# Patient Record
Sex: Male | Born: 1955 | Race: Black or African American | Hispanic: Refuse to answer | Marital: Married | Smoking: Never smoker
Health system: Southern US, Community
[De-identification: ages and names within clinical notes are randomized; demographics above are authoritative.]

---

## 1998-03-17 ENCOUNTER — Encounter: Admission: RE | Admit: 1998-03-17 | Discharge: 1998-03-17 | Payer: Self-pay | Admitting: Family Medicine

## 1998-07-07 ENCOUNTER — Encounter: Admission: RE | Admit: 1998-07-07 | Discharge: 1998-07-07 | Payer: Self-pay | Admitting: Family Medicine

## 1998-07-27 ENCOUNTER — Encounter: Admission: RE | Admit: 1998-07-27 | Discharge: 1998-07-27 | Payer: Self-pay | Admitting: Family Medicine

## 1998-08-05 ENCOUNTER — Encounter: Admission: RE | Admit: 1998-08-05 | Discharge: 1998-08-05 | Payer: Self-pay | Admitting: Family Medicine

## 1998-09-12 ENCOUNTER — Encounter: Admission: RE | Admit: 1998-09-12 | Discharge: 1998-09-12 | Payer: Self-pay | Admitting: Family Medicine

## 1998-11-29 ENCOUNTER — Encounter: Admission: RE | Admit: 1998-11-29 | Discharge: 1998-11-29 | Payer: Self-pay | Admitting: Family Medicine

## 1999-08-08 ENCOUNTER — Encounter: Admission: RE | Admit: 1999-08-08 | Discharge: 1999-08-08 | Payer: Self-pay | Admitting: *Deleted

## 1999-09-01 ENCOUNTER — Encounter: Admission: RE | Admit: 1999-09-01 | Discharge: 1999-09-01 | Payer: Self-pay | Admitting: Sports Medicine

## 1999-09-11 ENCOUNTER — Encounter: Admission: RE | Admit: 1999-09-11 | Discharge: 1999-09-11 | Payer: Self-pay | Admitting: Family Medicine

## 2000-04-16 ENCOUNTER — Encounter: Admission: RE | Admit: 2000-04-16 | Discharge: 2000-04-16 | Payer: Self-pay | Admitting: Family Medicine

## 2000-06-06 ENCOUNTER — Encounter: Admission: RE | Admit: 2000-06-06 | Discharge: 2000-06-06 | Payer: Self-pay | Admitting: Family Medicine

## 2000-06-17 ENCOUNTER — Encounter: Admission: RE | Admit: 2000-06-17 | Discharge: 2000-06-17 | Payer: Self-pay | Admitting: Family Medicine

## 2000-06-26 ENCOUNTER — Encounter: Admission: RE | Admit: 2000-06-26 | Discharge: 2000-06-26 | Payer: Self-pay | Admitting: Family Medicine

## 2000-07-26 ENCOUNTER — Encounter: Admission: RE | Admit: 2000-07-26 | Discharge: 2000-07-26 | Payer: Self-pay | Admitting: Family Medicine

## 2000-08-28 ENCOUNTER — Encounter: Admission: RE | Admit: 2000-08-28 | Discharge: 2000-08-28 | Payer: Self-pay | Admitting: Family Medicine

## 2001-02-21 ENCOUNTER — Encounter: Admission: RE | Admit: 2001-02-21 | Discharge: 2001-02-21 | Payer: Self-pay | Admitting: Family Medicine

## 2001-04-17 ENCOUNTER — Encounter: Admission: RE | Admit: 2001-04-17 | Discharge: 2001-04-17 | Payer: Self-pay | Admitting: Family Medicine

## 2002-03-26 ENCOUNTER — Encounter: Admission: RE | Admit: 2002-03-26 | Discharge: 2002-03-26 | Payer: Self-pay | Admitting: Family Medicine

## 2002-04-06 ENCOUNTER — Encounter: Admission: RE | Admit: 2002-04-06 | Discharge: 2002-04-06 | Payer: Self-pay | Admitting: Family Medicine

## 2002-06-01 ENCOUNTER — Encounter: Payer: Self-pay | Admitting: Sports Medicine

## 2002-06-01 ENCOUNTER — Encounter: Admission: RE | Admit: 2002-06-01 | Discharge: 2002-06-01 | Payer: Self-pay | Admitting: Sports Medicine

## 2002-06-01 ENCOUNTER — Encounter: Admission: RE | Admit: 2002-06-01 | Discharge: 2002-06-01 | Payer: Self-pay | Admitting: Family Medicine

## 2002-06-22 ENCOUNTER — Encounter: Admission: RE | Admit: 2002-06-22 | Discharge: 2002-06-22 | Payer: Self-pay | Admitting: Family Medicine

## 2002-07-06 ENCOUNTER — Encounter: Admission: RE | Admit: 2002-07-06 | Discharge: 2002-07-06 | Payer: Self-pay | Admitting: Family Medicine

## 2002-07-06 ENCOUNTER — Encounter: Payer: Self-pay | Admitting: Family Medicine

## 2002-12-26 ENCOUNTER — Encounter: Payer: Self-pay | Admitting: Emergency Medicine

## 2002-12-26 ENCOUNTER — Emergency Department (HOSPITAL_COMMUNITY): Admission: EM | Admit: 2002-12-26 | Discharge: 2002-12-26 | Payer: Self-pay | Admitting: Emergency Medicine

## 2003-02-05 ENCOUNTER — Encounter: Admission: RE | Admit: 2003-02-05 | Discharge: 2003-02-05 | Payer: Self-pay | Admitting: Sports Medicine

## 2003-02-05 ENCOUNTER — Encounter: Admission: RE | Admit: 2003-02-05 | Discharge: 2003-02-05 | Payer: Self-pay | Admitting: Family Medicine

## 2003-03-30 ENCOUNTER — Encounter: Admission: RE | Admit: 2003-03-30 | Discharge: 2003-03-30 | Payer: Self-pay | Admitting: Family Medicine

## 2003-11-19 ENCOUNTER — Ambulatory Visit: Payer: Self-pay | Admitting: Family Medicine

## 2004-01-28 ENCOUNTER — Ambulatory Visit: Payer: Self-pay | Admitting: Sports Medicine

## 2005-01-18 ENCOUNTER — Ambulatory Visit: Payer: Self-pay | Admitting: Family Medicine

## 2005-04-19 ENCOUNTER — Ambulatory Visit: Payer: Self-pay | Admitting: Family Medicine

## 2005-07-19 ENCOUNTER — Ambulatory Visit: Payer: Self-pay | Admitting: Family Medicine

## 2005-08-10 ENCOUNTER — Ambulatory Visit: Payer: Self-pay | Admitting: Family Medicine

## 2005-08-20 ENCOUNTER — Encounter: Admission: RE | Admit: 2005-08-20 | Discharge: 2005-08-20 | Payer: Self-pay | Admitting: Sports Medicine

## 2005-09-14 ENCOUNTER — Ambulatory Visit: Payer: Self-pay | Admitting: Family Medicine

## 2006-03-29 ENCOUNTER — Ambulatory Visit: Payer: Self-pay | Admitting: Family Medicine

## 2006-05-24 ENCOUNTER — Ambulatory Visit: Payer: Self-pay | Admitting: Sports Medicine

## 2006-05-24 ENCOUNTER — Encounter (INDEPENDENT_AMBULATORY_CARE_PROVIDER_SITE_OTHER): Payer: Self-pay | Admitting: Family Medicine

## 2006-05-24 DIAGNOSIS — E7800 Pure hypercholesterolemia, unspecified: Secondary | ICD-10-CM | POA: Insufficient documentation

## 2006-05-24 DIAGNOSIS — E039 Hypothyroidism, unspecified: Secondary | ICD-10-CM | POA: Insufficient documentation

## 2006-05-24 DIAGNOSIS — I10 Essential (primary) hypertension: Secondary | ICD-10-CM | POA: Insufficient documentation

## 2006-05-24 LAB — CONVERTED CEMR LAB
ALT: 40 units/L (ref 0–53)
AST: 34 units/L (ref 0–37)
Alkaline Phosphatase: 61 units/L (ref 39–117)
Cholesterol: 206 mg/dL — ABNORMAL HIGH (ref 0–200)
Creatinine, Ser: 0.95 mg/dL (ref 0.40–1.50)
Free T4: 0.99 ng/dL (ref 0.89–1.80)
LDL Cholesterol: 127 mg/dL — ABNORMAL HIGH (ref 0–99)
Sodium: 145 meq/L (ref 135–145)
TSH: 0.853 microintl units/mL (ref 0.350–5.50)
Total Bilirubin: 0.8 mg/dL (ref 0.3–1.2)
Total CHOL/HDL Ratio: 3.3
Total Protein: 8 g/dL (ref 6.0–8.3)
VLDL: 16 mg/dL (ref 0–40)

## 2007-04-01 ENCOUNTER — Ambulatory Visit (HOSPITAL_COMMUNITY): Admission: RE | Admit: 2007-04-01 | Discharge: 2007-04-01 | Payer: Self-pay | Admitting: Family Medicine

## 2007-04-01 ENCOUNTER — Ambulatory Visit: Payer: Self-pay | Admitting: Family Medicine

## 2007-04-10 ENCOUNTER — Encounter: Payer: Self-pay | Admitting: Family Medicine

## 2007-04-15 ENCOUNTER — Encounter: Payer: Self-pay | Admitting: Family Medicine

## 2007-05-16 ENCOUNTER — Encounter: Payer: Self-pay | Admitting: Family Medicine

## 2007-05-16 ENCOUNTER — Ambulatory Visit: Payer: Self-pay | Admitting: Family Medicine

## 2007-05-16 LAB — CONVERTED CEMR LAB
ALT: 30 units/L (ref 0–53)
Alkaline Phosphatase: 51 units/L (ref 39–117)
CO2: 24 meq/L (ref 19–32)
Cholesterol: 201 mg/dL — ABNORMAL HIGH (ref 0–200)
Creatinine, Ser: 0.96 mg/dL (ref 0.40–1.50)
LDL Cholesterol: 135 mg/dL — ABNORMAL HIGH (ref 0–99)
PSA: 0.47 ng/mL (ref 0.10–4.00)
Sodium: 144 meq/L (ref 135–145)
Total Bilirubin: 0.7 mg/dL (ref 0.3–1.2)
Total CHOL/HDL Ratio: 3.7
VLDL: 11 mg/dL (ref 0–40)

## 2007-05-19 ENCOUNTER — Encounter: Payer: Self-pay | Admitting: Family Medicine

## 2007-12-12 ENCOUNTER — Encounter: Payer: Self-pay | Admitting: Family Medicine

## 2007-12-12 ENCOUNTER — Ambulatory Visit: Payer: Self-pay | Admitting: Family Medicine

## 2007-12-12 LAB — CONVERTED CEMR LAB
AST: 27 units/L (ref 0–37)
BUN: 13 mg/dL (ref 6–23)
CO2: 26 meq/L (ref 19–32)
Calcium: 9.4 mg/dL (ref 8.4–10.5)
Chloride: 104 meq/L (ref 96–112)
Creatinine, Ser: 1 mg/dL (ref 0.40–1.50)
TSH: 0.596 microintl units/mL (ref 0.350–4.50)
Total Bilirubin: 0.7 mg/dL (ref 0.3–1.2)

## 2007-12-15 ENCOUNTER — Encounter: Payer: Self-pay | Admitting: Family Medicine

## 2008-11-29 ENCOUNTER — Encounter: Payer: Self-pay | Admitting: Family Medicine

## 2008-11-29 ENCOUNTER — Ambulatory Visit: Payer: Self-pay | Admitting: Family Medicine

## 2008-11-29 LAB — CONVERTED CEMR LAB
Alkaline Phosphatase: 56 units/L (ref 39–117)
BUN: 14 mg/dL (ref 6–23)
CO2: 25 meq/L (ref 19–32)
Cholesterol: 204 mg/dL — ABNORMAL HIGH (ref 0–200)
Creatinine, Ser: 0.96 mg/dL (ref 0.40–1.50)
Glucose, Bld: 97 mg/dL (ref 70–99)
HDL: 63 mg/dL (ref >39)
LDL Cholesterol: 126 mg/dL — ABNORMAL HIGH (ref 0–99)
Total Bilirubin: 0.5 mg/dL (ref 0.3–1.2)
Total CHOL/HDL Ratio: 3.2
Total Protein: 7.5 g/dL (ref 6.0–8.3)
Triglycerides: 76 mg/dL (ref <150)
VLDL: 15 mg/dL (ref 0–40)

## 2008-11-30 ENCOUNTER — Encounter: Payer: Self-pay | Admitting: Family Medicine

## 2009-04-11 ENCOUNTER — Encounter: Payer: Self-pay | Admitting: Family Medicine

## 2009-04-29 ENCOUNTER — Encounter: Payer: Self-pay | Admitting: Family Medicine

## 2009-11-04 ENCOUNTER — Ambulatory Visit: Payer: Self-pay | Admitting: Family Medicine

## 2009-11-04 DIAGNOSIS — H60339 Swimmer's ear, unspecified ear: Secondary | ICD-10-CM

## 2009-12-23 ENCOUNTER — Ambulatory Visit: Payer: Self-pay | Admitting: Family Medicine

## 2009-12-23 ENCOUNTER — Encounter: Payer: Self-pay | Admitting: Family Medicine

## 2009-12-23 DIAGNOSIS — L219 Seborrheic dermatitis, unspecified: Secondary | ICD-10-CM

## 2009-12-23 LAB — CONVERTED CEMR LAB
BUN: 15 mg/dL (ref 6–23)
CO2: 27 meq/L (ref 19–32)
Cholesterol: 199 mg/dL (ref 0–200)
Glucose, Bld: 83 mg/dL (ref 70–99)
HDL: 61 mg/dL (ref >39)
Sodium: 142 meq/L (ref 135–145)
Total Bilirubin: 0.7 mg/dL (ref 0.3–1.2)
Total Protein: 7.7 g/dL (ref 6.0–8.3)
Triglycerides: 60 mg/dL (ref <150)
VLDL: 12 mg/dL (ref 0–40)

## 2009-12-27 ENCOUNTER — Encounter: Payer: Self-pay | Admitting: Family Medicine

## 2010-04-18 NOTE — Miscellaneous (Signed)
Summary: [facility]  [facility]   Imported By: Kenneth Tapia 04/29/2009 11:15:40  _____________________________________________________________________  External Attachment:    Type:   Image     Comment:   External Document

## 2010-04-18 NOTE — Miscellaneous (Signed)
  Clinical Lists Changes  Observations: Added new observation of DM PROGRESS: N/A (04/29/2009 11:30) Added new observation of DM FSREVIEW: N/A (04/29/2009 11:30)      Prevention & Chronic Care Immunizations   Influenza vaccine: given  (12/12/2007)   Influenza vaccine due: 12/11/2008    Tetanus booster: 12/12/2007: given   Tetanus booster due: 12/11/2017    Pneumococcal vaccine: Not documented  Colorectal Screening   Hemoccult: Done.  (08/18/1999)   Hemoccult due: Not Indicated    Colonoscopy: Done.  (05/17/2005)   Colonoscopy due: 05/18/2015  Other Screening   PSA: 0.35  (11/29/2008)   PSA action/deferral: Discussed-PSA requested  (11/29/2008)   PSA due due: 05/15/2008   Smoking status: never  (11/29/2008)  Lipids   Total Cholesterol: 204  (11/29/2008)   Lipid panel action/deferral: Lipid Panel ordered   LDL: 126  (11/29/2008)   LDL Direct: 126  (12/12/2007)   HDL: 63  (11/29/2008)   Triglycerides: 76  (11/29/2008)    SGOT (AST): 35  (11/29/2008)   SGPT (ALT): 35  (11/29/2008)   Alkaline phosphatase: 56  (11/29/2008)   Total bilirubin: 0.5  (11/29/2008)  Hypertension   Last Blood Pressure: 134 / 97  (11/29/2008)   Serum creatinine: 0.96  (11/29/2008)   BMP action: Ordered   Serum potassium 4.7  (11/29/2008)  Self-Management Support :   Personal Goals (by the next clinic visit) :      Personal blood pressure goal: 130/80  (11/29/2008)     Personal LDL goal: 100  (11/29/2008)    Hypertension self-management support: Not documented    Lipid self-management support: Not documented

## 2010-04-18 NOTE — Assessment & Plan Note (Signed)
Summary: cpe,df   Vital Signs:  Patient profile:   55 year old male Height:      72.75 inches Weight:      248 pounds BMI:     33.06 Pulse rate:   78 / minute BP sitting:   130 / 80  (right arm) Cuff size:   large  Vitals Entered By: Renato Battles slade,cma CC: physical. flu shot. refill meds Is Patient Diabetic? No Pain Assessment Patient in pain? no        Primary Care Makeda Peeks:  Luretha Murphy NP  CC:  physical. flu shot. refill meds.  History of Present Illness: Yearly visit for med refills.  He feels well.  He exercises some and knows he should eat better and exercise more but is not motivated to change.  Denies depression.  Had been seeing a dermatologist to treat seborrhea on face and scalp and prefers to to return for high co-pay.  Uses topical steroid on scalp and combo ketoconazole/steroid on face, this works although this is a chronic problem.    Habits & Providers  Alcohol-Tobacco-Diet     Tobacco Status: never  Current Medications (verified): 1)  Crestor 20 Mg  Tabs (Rosuvastatin Calcium) .... One Qhs 2)  Lisinopril-Hydrochlorothiazide 20-12.5 Mg Tabs (Lisinopril-Hydrochlorothiazide) .... One Daily, 3)  Aspir-Low 81 Mg Tbec (Aspirin) .Marland Kitchen.. 1 Once Daily After Meal 4)  Daily Vitamins  Tabs (Multiple Vitamin) 5)  Ketoconazole 2 % Crea (Ketoconazole) .... Compound With Cutivate in A 1:1 Combo, 60 Gm 6)  Clobetasol Propionate 0.05 % Soln (Clobetasol Propionate) .... As Directed Twice Weekly, 50 Ml  Allergies (verified): No Known Drug Allergies  Past History:  Past Surgical History: Last updated: 04/01/2007 Colonoscopy (internal hemorroids) - 05/17/2005,  MRI - cx spine mild MRI - cx spine mild DJD - 07/29/2002  Family History: Last updated: 12/23/2009 father - hypercholesterol, no cad mother-Colon  adenomatous polyps  Social History: Last updated: 12/23/2009 No smoking, no ETOH use. Married. Exercise daily  1 hour a day (treadmil and lifting weights). Works  @ [employer].  Past Medical History: hx of elevated transaminases w/o etiology,  lipoma L side trunk (05/07),  nephrolithiasis  w/ severe obstruction 06/07:lumbar spine: spondylolisthesis L1-L2, osteophytes,post facet changes,  Family History: Reviewed history from 05/16/2006 and no changes required. father - hypercholesterol, no cad mother-Colon  adenomatous polyps  Social History: Reviewed history from 05/16/2006 and no changes required. No smoking, no ETOH use. Married. Exercise daily  1 hour a day (treadmil and lifting weights). Works @ [employer].  Review of Systems      See HPI General:  Denies malaise, sleep disorder, and sweats. CV:  Denies chest pain or discomfort and swelling of feet. GI:  Denies constipation. GU:  Denies urinary hesitancy. MS:  Denies joint pain. Derm:  Complains of changes in nail beds, dryness, itching, and rash.  Physical Exam  General:  Well-developed,well-nourished,in no acute distress; alert,appropriate and cooperative throughout examination Eyes:  No corneal or conjunctival inflammation noted. EOMI. Perrla. Funduscopic exam benign, without hemorrhages, exudates or papilledema.  Ears:  R ear normal and L ear normal.   Mouth:  pharynx pink and moist and fair dentition.   Neck:  No deformities, masses, or tenderness noted. Lungs:  normal respiratory effort and normal breath sounds.   Heart:  normal rate, regular rhythm, and no murmur.  occasional extra beats Abdomen:  soft, non-tender, and normal bowel sounds.   Msk:  No deformity or scoliosis noted of thoracic or lumbar spine.  Extremities:  No clubbing, cyanosis, edema, or deformity noted with normal full range of motion of all joints.   Skin:  seborrheic changes in T zone of face and annular raised sclay areas on the scalp. Psych:  normally interactive.     Impression & Recommendations:  Problem # 1:  HEALTH MAINTENANCE EXAM (ICD-V70.0)  Orders: [facility] - Est  40-64 yrs  ([address]109)  Problem # 2:  HYPERLIPIDEMIA (ICD-272.4)  His updated medication list for this problem includes:    Crestor 20 Mg Tabs (Rosuvastatin calcium) ..... One qhs  Orders: Comp Met-[facility] [id number]) Lipid-[facility] ([id number])  Problem # 3:  HYPERTENSION, BENIGN (ICD-401.1)  His updated medication list for this problem includes:    Lisinopril-hydrochlorothiazide 20-12.5 Mg Tabs (Lisinopril-hydrochlorothiazide) ..... One daily,  Orders: Comp Met-[facility] ([id number])  Problem # 4:  DERMATITIS, SEBORRHEIC (ICD-690.10)  refilled topicals  Complete Medication List: 1)  Crestor 20 Mg Tabs (Rosuvastatin calcium) .... One qhs 2)  Lisinopril-hydrochlorothiazide 20-12.5 Mg Tabs (Lisinopril-hydrochlorothiazide) .... One daily, 3)  Aspir-low 81 Mg Tbec (Aspirin) .Marland Kitchen.. 1 once daily after meal 4)  Daily Vitamins Tabs (Multiple vitamin) 5)  Ketoconazole 2 % Crea (Ketoconazole) .... Compound with cutivate in a 1:1 combo, 60 gm 6)  Clobetasol Propionate 0.05 % Soln (Clobetasol propionate) .... As directed twice weekly, 50 ml  Other Orders: Influenza Vaccine NON MCR (96295) TSH-[facility] ([id number]) PSA-[facility] ([id number])  Patient Instructions: 1)  Please schedule a follow-up appointment as needed .  Prescriptions: CRESTOR 20 MG  TABS (ROSUVASTATIN CALCIUM) one qhs Brand medically necessary #31 x 11   Entered and Authorized by:   Luretha Murphy NP   Signed by:   Luretha Murphy NP on 12/23/2009   Method used:   Print then Give to Patient   RxID:   [id number] LISINOPRIL-HYDROCHLOROTHIAZIDE 20-12.5 MG TABS (LISINOPRIL-HYDROCHLOROTHIAZIDE) one daily, Brand medically necessary #90 x 3   Entered and Authorized by:   Luretha Murphy NP   Signed by:   Luretha Murphy NP on 12/23/2009   Method used:   Print then Give to Patient   RxID:   [id number] CLOBETASOL PROPIONATE 0.05 % SOLN (CLOBETASOL PROPIONATE) as directed twice weekly, 50 ml Brand medically necessary #1 x 6   Entered and Authorized by:   Luretha Murphy NP   Signed by:   Luretha Murphy NP on 12/23/2009   Method used:   Print then Give to Patient   RxID:   [id number] KETOCONAZOLE 2 % CREA (KETOCONAZOLE) compound with cutivate in a 1:1 combo, 60 GM Brand medically necessary #1 x 6   Entered and Authorized by:   Luretha Murphy NP   Signed by:   Luretha Murphy NP on 12/23/2009   Method used:   Print then Give to Patient   RxID:   [id number]    Influenza Vaccine    Vaccine Type: Fluvax Non-MCR    Site: left deltoid    Mfr: GlaxoSmithKline    Dose: 0.5 ml    Route: IM    Given by: Arlyss Repress CMA,    Exp. Date: 09/13/2010    Lot #: [id number]    VIS given: 10/11/09 version given December 23, 2009.  Flu Vaccine Consent Questions    Do you have a history of severe allergic reactions to this vaccine? no    Any prior history of allergic reactions to egg and/or gelatin? no    Do you have a sensitivity to the preservative Thimersol? no    Do you have a past history of Guillan-Barre  Syndrome? no    Do you currently have an acute febrile illness? no    Have you ever had a severe reaction to latex? no    Vaccine information given and explained to patient? yes   Prevention & Chronic Care Immunizations   Influenza vaccine: Fluvax Non-MCR  (12/23/2009)   Influenza vaccine due: 12/11/2008    Tetanus booster: 12/12/2007: given   Tetanus booster due: 12/11/2017    Pneumococcal vaccine: Not documented  Colorectal Screening   Hemoccult: Done.  (08/18/1999)   Hemoccult due: Not Indicated    Colonoscopy: Done.  (05/17/2005)   Colonoscopy due: 05/18/2015  Other Screening   PSA: 0.35  (11/29/2008)   PSA ordered.   PSA action/deferral: Discussed-PSA requested  (11/29/2008)   PSA due due: 05/15/2008   Smoking status: never  (12/23/2009)  Lipids   Total Cholesterol: 204  (11/29/2008)   Lipid panel action/deferral: Lipid Panel ordered   LDL: 126  (11/29/2008)   LDL Direct: 126  (12/12/2007)   HDL: 63  (11/29/2008)    Triglycerides: 76  (11/29/2008)    SGOT (AST): 35  (11/29/2008)   SGPT (ALT): 35  (11/29/2008) CMP ordered    Alkaline phosphatase: 56  (11/29/2008)   Total bilirubin: 0.5  (11/29/2008)    Lipid flowsheet reviewed?: Yes   Progress toward LDL goal: Unchanged  Hypertension   Last Blood Pressure: 130 / 80  (12/23/2009)   Serum creatinine: 0.96  (11/29/2008)   BMP action: Ordered   Serum potassium 4.7  (11/29/2008) CMP ordered     Hypertension flowsheet reviewed?: Yes   Progress toward BP goal: At goal  Self-Management Support :   Personal Goals (by the next clinic visit) :      Personal blood pressure goal: 130/80  (11/29/2008)     Personal LDL goal: 130  (12/23/2009)    Hypertension self-management support: Not documented    Lipid self-management support: Not documented

## 2010-04-18 NOTE — Assessment & Plan Note (Signed)
Summary: ? ear infection,tcb   Vital Signs:  Patient profile:   55 year old male Height:      72.75 inches Weight:      249 pounds BMI:     33.20 Temp:     98.9 degrees F oral Pulse rate:   80 / minute BP sitting:   145 / 88  (left arm) Cuff size:   large  Vitals Entered By: Tessie Fass CMA (November 04, 2009 11:00 AM) CC: left ear ache   Primary Care Therin Vetsch:  Luretha Murphy NP  CC:  left ear ache.  History of Present Illness: 55 y/o M with HTN and HLD is here for left ear pain.    Pt would like his ear checked for infection.  He has had a cold since last Fri.  He flew in from [place] last Thurs and on Fri he noticed ear pain left ear. It feels like there is water in his ear.  hearing has changed, hearing goes in and out. not sure about discharge, but it feels damp to him.  No water exposure.    ?fever, +chills, + cough, chest congestion, sore throat improved, chest hurts to cough, no syncope, no sweating  Allergies (verified): No Known Drug Allergies  Physical Exam  General:  Well-developed,well-nourished,in no acute distress; alert,appropriate and cooperative throughout examination. vitals reviewed.  Ears:  R ear normal. L ear: tragus painful to pulling. The ear canal is typically edematous and erythematous with debris that is yellow/green. The tympanic membrane is only partially visible.  Mouth:  Oral mucosa and oropharynx without lesions or exudates.   Neck:  No deformities, masses, or tenderness noted. Cervical Nodes:  No lymphadenopathy noted   Impression & Recommendations:  Problem # 1:  OTITIS EXTERNA, ACUTE, LEFT (ICD-380.12) Assessment New Ear pain x 1 week.  Also dealing with URI symptoms.  Pt not diabetic so systemic atbx not necessary.  Will give  His updated medication list for this problem includes:    Cipro Hc 0.2-1 % Susp (Ciprofloxacin-hydrocortisone) .Marland KitchenMarland KitchenMarland KitchenMarland Kitchen 3 drops in each ear two times a day for 7 days.  dispense 1 bottle  Orders: [facility]- Est Level  3  (62130)  Complete Medication List: 1)  Crestor 20 Mg Tabs (Rosuvastatin calcium) .... One qhs 2)  Lisinopril-hydrochlorothiazide 20-12.5 Mg Tabs (Lisinopril-hydrochlorothiazide) .... One daily, 3)  Aspir-low 81 Mg Tbec (Aspirin) .Marland Kitchen.. 1 once daily after meal 4)  Daily Vitamins Tabs (Multiple vitamin) 5)  Cipro Hc 0.2-1 % Susp (Ciprofloxacin-hydrocortisone) .... 3 drops in each ear two times a day for 7 days.  dispense 1 bottle Prescriptions: CIPRO HC 0.2-1 % SUSP (CIPROFLOXACIN-HYDROCORTISONE) 3 drops in each ear two times a day for 7 days.  Dispense 1 bottle  #1 x 0   Entered and Authorized by:   Angeline Slim MD   Signed by:   Angeline Slim MD on 11/04/2009   Method used:   Print then Give to Patient   RxID:   [id number]

## 2010-04-18 NOTE — Letter (Signed)
Summary: Generic Letter  [facility]  [address]   [place], [place] [address]   Phone: [phone]  Fax: [phone]    12/27/2009  TREVIONE WERT [address] [place], [place]  [address]  Dear Mr. ATOR,   It was nice to see you the other day.  I have enclosed your results.  As you see the only abnormal is your LDL cholesterol, of course the Crestor is keeping that down so continue to take it as ordered.  As we discussed regular exercise and decreasing your intake of animal fats are also important.        Sincerely,   Luretha Murphy NP  Appended Document: Generic Letter mailed

## 2010-05-29 ENCOUNTER — Encounter: Payer: Self-pay | Admitting: Family Medicine

## 2010-05-29 ENCOUNTER — Ambulatory Visit (INDEPENDENT_AMBULATORY_CARE_PROVIDER_SITE_OTHER): Payer: BC Managed Care – PPO | Admitting: Family Medicine

## 2010-05-29 VITALS — BP 107/77 | HR 92 | Wt 258.0 lb

## 2010-05-29 DIAGNOSIS — I10 Essential (primary) hypertension: Secondary | ICD-10-CM

## 2010-05-29 DIAGNOSIS — M79676 Pain in unspecified toe(s): Secondary | ICD-10-CM

## 2010-05-29 DIAGNOSIS — M1A0790 Idiopathic chronic gout, unspecified ankle and foot, without tophus (tophi): Secondary | ICD-10-CM | POA: Insufficient documentation

## 2010-05-29 DIAGNOSIS — M79609 Pain in unspecified limb: Secondary | ICD-10-CM

## 2010-05-29 LAB — CONVERTED CEMR LAB: Uric Acid, Serum: 8 mg/dL — ABNORMAL HIGH (ref 4.0–7.8)

## 2010-05-29 LAB — URIC ACID: Uric Acid, Serum: 8 mg/dL — ABNORMAL HIGH (ref 4.0–7.8)

## 2010-05-29 MED ORDER — AMLODIPINE BESYLATE 10 MG PO TABS: 10.0000 mg | ORAL_TABLET | Freq: Every day | ORAL | Status: DC

## 2010-05-29 NOTE — Assessment & Plan Note (Signed)
Questionable angioedema related to ACE, will give him a trial off, add Norvasc at 5 mg and if BP not at goal increase to 10 mg, follow up apt in one month to sort out.

## 2010-05-29 NOTE — Progress Notes (Signed)
  Subjective:    Patient ID: Kenneth Tapia, male    DOB: 08/21/1955, 55 y.o.   MRN: [id number]  HPI:  Feels that his lips are swelling and that it may be the lisinopril, although he has been on this medication for several years.  He reports it seems to be getting worse.  Occurs at night mostly.  He would like to have a trial off to see if this is the cause.  He is also having gouty attacks of her left great toe, he reports swelling, heat and pain.  Several weeks ago he was unable to put shoe on.  He takes Aleve and it improves.  He has never reported this to a provider.    Review of Systems  Constitutional: Negative for activity change, appetite change and fatigue.  HENT: Positive for facial swelling. Negative for congestion and neck stiffness.   Respiratory: Negative for cough, shortness of breath, wheezing and stridor.   Cardiovascular: Negative for chest pain.  Musculoskeletal: Positive for joint swelling.       See HPI       Objective:   Physical Exam  Constitutional: He appears well-developed and well-nourished.  HENT:  Mouth/Throat: Oropharynx is clear and moist.  Cardiovascular: Normal rate, regular rhythm and normal heart sounds.   Pulmonary/Chest: Effort normal and breath sounds normal.  Musculoskeletal: Normal range of motion. He exhibits no tenderness.          Assessment & Plan:

## 2010-05-29 NOTE — Patient Instructions (Signed)
Purine in diet should be reduced If you have a gout attack, take 2 Aleve twice daily for 3 days Norasc (amlodapine) 1/2 tab then one tab if BP is not at goal, goal is 130/80 Return in one month for BP check    Gout Gout is an inflammatory condition (arthritis) caused by a buildup of uric acid crystals in the joints. Uric acid is a chemical that is normally present in the blood. Under some circumstances, uric acid can form into crystals in your joints. This causes joint redness, soreness, and swelling (inflammation). Repeat attacks are common. Over time, uric acid crystals can form into masses (tophi) near a joint, causing disfigurement. Gout is treatable and often preventable. CAUSES The disease begins with elevated levels of uric acid in the blood. Uric acid is produced by your body when it breaks down a naturally found substance called purines. This also happens when you eat certain foods such as meats and fish. Causes of an elevated uric acid level include:  Being passed down from parent to child (heredity).   Diseases that cause increased uric acid production (obesity, psoriasis, some cancers).   Excessive alcohol use.   Diet, especially diets rich in meat and seafood.   Medicines, including certain cancer-fighting drugs (chemotherapy), diuretics, and aspirin.   Chronic kidney disease. The kidneys are no longer able to remove uric acid well.   Problems with metabolism.  Conditions strongly associated with gout include:  Obesity.   High blood pressure.   High cholesterol.   Diabetes.  Not everyone with elevated uric acid levels gets gout. It is not understood why some people get gout and others do not. Surgery, joint injury, and eating too much of certain foods are some of the factors that can lead to gout. SYMPTOMS  An attack of gout comes on quickly. It causes intense pain with redness, swelling, and warmth in a joint.   Fever can occur.   Often, only one joint is  involved. Certain joints are more commonly involved:   Base of the big toe.   Knee.   Ankle.   Wrist.   Finger.  Without treatment, an attack usually goes away in a few days to weeks. Between attacks, you usually will not have symptoms, which is different from many other forms of arthritis. DIAGNOSIS Your caregiver will suspect gout based on your symptoms and exam. Removal of fluid from the joint (arthrocentesis) is done to check for uric acid crystals. Your caregiver will give you a medicine that numbs the area (local anesthetic) and use a needle to remove joint fluid for exam. Gout is confirmed when uric acid crystals are seen in joint fluid, using a special microscope. Sometimes, blood, urine, and X-ray tests are also used. TREATMENT There are 2 phases to gout treatment: treating the sudden onset (acute) attack and preventing attacks (prophylaxis). Treatment of an Acute Attack  Medicines are used. These include anti-inflammatory medicines or steroid medicines.   An injection of steroid medicine into the affected joint is sometimes necessary.   The painful joint is rested. Movement can worsen the arthritis.   You may use warm or cold treatments on painful joints, depending which works best for you.   Discuss the use of coffee, vitamin C, or cherries with your caregiver. These may be helpful treatment options.  Treatment to Prevent Attacks After the acute attack subsides, your caregiver may advise prophylactic medicine. These medicines either help your kidneys eliminate uric acid from your body or decrease your  uric acid production. You may need to stay on these medicines for a very long time. The early phase of treatment with prophylactic medicine can be associated with an increase in acute gout attacks. For this reason, during the first few months of treatment, your caregiver may also advise you to take medicines usually used for acute gout treatment. Be sure you understand your  caregiver's directions. You should also discuss dietary treatment with your caregiver. Certain foods such as meats and fish can increase uric acid levels. Other foods such as dairy can decrease levels. Your caregiver can give you a list of foods to avoid. HOME CARE INSTRUCTIONS  Do not take aspirin to relieve pain. This raises uric acid levels.   Only take over-the-counter or prescription medicines for pain, discomfort, or fever as directed by your caregiver.   Rest the joint as much as possible. When in bed, keep sheets and blankets off painful areas.   Keep the affected joint raised (elevated).   Use crutches if the painful joint is in your leg.   Drink enough water and fluids to keep your urine clear or pale yellow. This helps your body get rid of uric acid. Do not drink alcoholic beverages. They slow the passage of uric acid.   Follow your caregiver's dietary instructions. Pay careful attention to the amount of protein you eat. Your daily diet should emphasize fruits, vegetables, whole grains, and fat-free or low-fat milk products.   Maintain a healthy body weight.  SEEK MEDICAL CARE IF:  You have an oral temperature above 103.   You develop diarrhea, vomiting, or any side effects from medicines.   You do not feel better in 24 hours, or you are getting worse.  SEEK IMMEDIATE MEDICAL CARE IF:  Your joint becomes suddenly more tender and you have:   Chills.   An oral temperature above 102, not controlled by medicine.  MAKE SURE YOU:  Understand these instructions.   Will watch your condition.   Will get help right away if you are not doing well or get worse.  Document Released: 03/02/2000 Document Re-Released: 08/23/2009 [facility] Patient Information 2011 [place], [place].

## 2010-05-29 NOTE — Assessment & Plan Note (Signed)
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

## 2010-05-31 ENCOUNTER — Encounter: Payer: Self-pay | Admitting: Family Medicine

## 2010-05-31 MED ORDER — ALLOPURINOL 100 MG PO TABS: 100.0000 mg | ORAL_TABLET | Freq: Two times a day (BID) | ORAL | Status: DC

## 2010-05-31 NOTE — Progress Notes (Signed)
Addended by: Nilda Simmer on: 05/31/2010 02:24 PM   Modules accepted: Orders

## 2010-07-03 ENCOUNTER — Encounter: Payer: Self-pay | Admitting: Family Medicine

## 2010-07-03 ENCOUNTER — Ambulatory Visit (INDEPENDENT_AMBULATORY_CARE_PROVIDER_SITE_OTHER): Payer: BC Managed Care – PPO | Admitting: Family Medicine

## 2010-07-03 DIAGNOSIS — M109 Gout, unspecified: Secondary | ICD-10-CM

## 2010-07-03 DIAGNOSIS — I10 Essential (primary) hypertension: Secondary | ICD-10-CM

## 2010-07-03 LAB — COMPREHENSIVE METABOLIC PANEL
AST: 46 U/L — ABNORMAL HIGH (ref 0–37)
Albumin: 4.6 g/dL (ref 3.5–5.2)
Alkaline Phosphatase: 69 U/L (ref 39–117)
Chloride: 104 mEq/L (ref 96–112)
Glucose, Bld: 82 mg/dL (ref 70–99)
Potassium: 3.9 mEq/L (ref 3.5–5.3)
Sodium: 141 mEq/L (ref 135–145)
Total Protein: 7.7 g/dL (ref 6.0–8.3)

## 2010-07-03 NOTE — Assessment & Plan Note (Signed)
BP not quite at goal but reasonable, will continue Norvasc 10 mg and he is trying to loose weight and exercise

## 2010-07-03 NOTE — Assessment & Plan Note (Signed)
Recheck UA on 200 mg allopurinol
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
## 2010-07-03 NOTE — Patient Instructions (Signed)
I will send you a letter regarding your uric acid See you in September

## 2010-07-04 ENCOUNTER — Encounter: Payer: Self-pay | Admitting: Family Medicine

## 2010-12-29 ENCOUNTER — Encounter: Payer: Self-pay | Admitting: Family Medicine

## 2010-12-29 ENCOUNTER — Ambulatory Visit (INDEPENDENT_AMBULATORY_CARE_PROVIDER_SITE_OTHER): Payer: BC Managed Care – PPO | Admitting: Family Medicine

## 2010-12-29 VITALS — BP 129/87 | HR 82 | Temp 98.2°F | Ht 72.75 in | Wt 272.0 lb

## 2010-12-29 DIAGNOSIS — E039 Hypothyroidism, unspecified: Secondary | ICD-10-CM

## 2010-12-29 DIAGNOSIS — Z23 Encounter for immunization: Secondary | ICD-10-CM

## 2010-12-29 DIAGNOSIS — Z125 Encounter for screening for malignant neoplasm of prostate: Secondary | ICD-10-CM

## 2010-12-29 DIAGNOSIS — E785 Hyperlipidemia, unspecified: Secondary | ICD-10-CM

## 2010-12-29 DIAGNOSIS — I10 Essential (primary) hypertension: Secondary | ICD-10-CM

## 2010-12-29 LAB — COMPREHENSIVE METABOLIC PANEL
ALT: 50 U/L (ref 0–53)
Alkaline Phosphatase: 67 U/L (ref 39–117)
CO2: 26 mEq/L (ref 19–32)
Creat: 0.98 mg/dL (ref 0.50–1.35)
Glucose, Bld: 90 mg/dL (ref 70–99)
Sodium: 144 mEq/L (ref 135–145)
Total Bilirubin: 0.8 mg/dL (ref 0.3–1.2)
Total Protein: 7.7 g/dL (ref 6.0–8.3)

## 2010-12-29 LAB — LIPID PANEL
HDL: 52 mg/dL (ref >39)
LDL Cholesterol: 136 mg/dL — ABNORMAL HIGH (ref 0–99)
Total CHOL/HDL Ratio: 3.8 Ratio
Triglycerides: 60 mg/dL (ref <150)

## 2010-12-29 MED ORDER — CLOBETASOL PROPIONATE 0.05 % EX SOLN: CUTANEOUS | Status: DC

## 2010-12-29 MED ORDER — AMLODIPINE BESYLATE 5 MG PO TABS: 5.0000 mg | ORAL_TABLET | Freq: Every day | ORAL | Status: DC

## 2010-12-29 MED ORDER — KETOCONAZOLE 2 % EX CREA: TOPICAL_CREAM | Freq: Every day | CUTANEOUS | Status: DC

## 2010-12-29 NOTE — Assessment & Plan Note (Signed)
Previous angioedema with lisinopril; good BP control with lower extremity edema from amlodipine 10mg  daily.  Will reduce the dosing on norvasc, recheck BP in the coming 2 weeks and reassess his lower extremity swelling.  If BP controlled on lower dose and edema is less

## 2010-12-29 NOTE — Progress Notes (Signed)
  Subjective:    Patient ID: Kenneth Tapia, male    DOB: Oct 27, 1955, 55 y.o.   MRN: [id number]  HPI Patient here for checkup.  He has been seen in the past for HTN, managed on amlodipine 10mg  daily.  Has had ankle edema since starting the amlodipine, which was started after he developed angioedema on lisinopril/HCTZ combo.  BP has been well controlled, but ankle edema is bothersome.  Has had seborrhea of scalp, treated with topical clobetasol twice weekly; had tried selenium and tar shampoos in the past, no relief.  Also with central facial pustular outbreaks, which are well controlled with topical ketoconazole 2% with good results.   Has appointment for screening colonoscopy with Dr Bosie Clos in 3 weeks; previously had colonoscopy 5 years ago.  No family history colon cancer, although mother had colon polyps without cancer.   Review of Systems  Denies chest pain, no shortness of breath, no cough, no fevers or chills, no abdominal pain, no dysuria but does have painless nocturia 3x/noc if he drinks water before bed.  Good urinary stream, no diarrhea or constipation, no nausea or vomiting. Ankle edema as described  Family Hx: No prostate or colon cancer.  No diabetes history.  Mother had CHF as only family hx of heart disease.    Objective:   Physical Exam  Constitutional: He is oriented to person, place, and time. He appears well-developed and well-nourished. No distress.  HENT:  Head: Normocephalic and atraumatic.  Mouth/Throat: No oropharyngeal exudate.       Moist mucus membranes; clear oropharynx  Neck: Normal range of motion. No thyromegaly present.  Cardiovascular: Normal rate, regular rhythm, normal heart sounds and intact distal pulses.   Abdominal: Soft. Bowel sounds are normal. He exhibits no distension and no mass. There is no tenderness.  Musculoskeletal: He exhibits edema. He exhibits no tenderness.       bialteral lower extremity edema, symmetric.  Palpable dp pulses  bilaterally. Nonpitting.  No cords.  Lymphadenopathy:    He has no cervical adenopathy.  Neurological: He is alert and oriented to person, place, and time.  Skin: Skin is warm and dry. He is not diaphoretic.       Clean-shaven scalp.  Slight patch seborrhea central scalp.         Assessment & Plan:

## 2010-12-29 NOTE — Patient Instructions (Addendum)
It was a pleasure to see you.  I have lowered your amlodipine from 10mg  to 5mg  a day to reduce swelling.  I would like to have your BP checked in the office at an RN visit in the coming 2 to 3 weeks.  I am glad to hear you will be having your screening colonoscopy with Dr Bosie Clos.  I will contact you with the results of your labs; either by letter or phone.

## 2010-12-29 NOTE — Assessment & Plan Note (Signed)
Continue crestor.  Recheck transaminases.

## 2010-12-30 LAB — PSA: PSA: 0.54 ng/mL (ref <=4.00)

## 2011-01-01 ENCOUNTER — Encounter: Payer: Self-pay | Admitting: Family Medicine

## 2011-01-06 ENCOUNTER — Other Ambulatory Visit: Payer: Self-pay | Admitting: Family Medicine

## 2011-01-08 NOTE — Telephone Encounter (Signed)
Refill request

## 2011-01-19 ENCOUNTER — Ambulatory Visit (INDEPENDENT_AMBULATORY_CARE_PROVIDER_SITE_OTHER): Payer: BC Managed Care – PPO | Admitting: *Deleted

## 2011-01-19 VITALS — BP 132/96 | HR 80

## 2011-01-19 DIAGNOSIS — I10 Essential (primary) hypertension: Secondary | ICD-10-CM

## 2011-01-19 NOTE — Progress Notes (Signed)
In for BP check.  BP checked manually using large adult cuff.  BP LA 136/98 and RA 132/96 pulse 80. States his swelling is improved. No edema noted today. Has decreased amlodipine  as directed at last visit. Consulted Dr.Breen and he advises to continue current meds  and follow up with PCP . Patient cannot come in until after Thanksgiving . Dr. Mauricio Po states this will be fine.

## 2011-01-30 ENCOUNTER — Encounter: Payer: Self-pay | Admitting: Family Medicine

## 2011-01-30 NOTE — Progress Notes (Signed)
  Subjective:    Patient ID: Kenneth Tapia, male    DOB: 04-12-1955, 55 y.o.   MRN: [id number]  HPI Received records of normal colonoscopy done 01/25/11 by [facility], Dr. Bosie Clos.  Will need 10 year follow up.   Review of Systems     Objective:   Physical Exam        Assessment & Plan:

## 2011-05-21 ENCOUNTER — Other Ambulatory Visit: Payer: Self-pay | Admitting: Family Medicine

## 2011-05-22 NOTE — Telephone Encounter (Signed)
Refill request

## 2011-06-20 ENCOUNTER — Ambulatory Visit (INDEPENDENT_AMBULATORY_CARE_PROVIDER_SITE_OTHER): Payer: BC Managed Care – PPO | Admitting: Family Medicine

## 2011-06-20 ENCOUNTER — Encounter: Payer: Self-pay | Admitting: Family Medicine

## 2011-06-20 VITALS — BP 131/86 | HR 73 | Temp 98.6°F | Ht 72.75 in | Wt 249.9 lb

## 2011-06-20 DIAGNOSIS — M109 Gout, unspecified: Secondary | ICD-10-CM

## 2011-06-20 DIAGNOSIS — E785 Hyperlipidemia, unspecified: Secondary | ICD-10-CM

## 2011-06-20 DIAGNOSIS — I10 Essential (primary) hypertension: Secondary | ICD-10-CM

## 2011-06-20 DIAGNOSIS — E039 Hypothyroidism, unspecified: Secondary | ICD-10-CM

## 2011-06-20 MED ORDER — ROSUVASTATIN CALCIUM 20 MG PO TABS: 20.0000 mg | ORAL_TABLET | Freq: Every day | ORAL | Status: DC

## 2011-06-20 MED ORDER — ALLOPURINOL 100 MG PO TABS: 200.0000 mg | ORAL_TABLET | Freq: Every day | ORAL | Status: DC

## 2011-06-20 NOTE — Assessment & Plan Note (Signed)
Refill meds

## 2011-06-20 NOTE — Patient Instructions (Signed)
Your last blood work (cholesterol) was done 12/29/10.  OK to schedule your annual exam and blood work any time after that.

## 2011-06-21 NOTE — Assessment & Plan Note (Signed)
TSH recently normal

## 2011-06-21 NOTE — Progress Notes (Signed)
  Subjective:    Patient ID: Kenneth Tapia, male    DOB: 1956/01/04, 56 y.o.   MRN: [id number]  HPI Patient not previously seen by me but now assigned to me.  Had comprehensive exam last October.  Seems up to date on his preventive med issues.  Needs refills on a couple of meds  He has a goal LDL of 130 and is slightly above that level.  He does not have a family hx of CAD or CVA.  He is non diabetic and not hypertensive.  Will recheck in 6 months and increase crestor if still above goal.   Review of Systems     Objective:   Physical Exam  Lungs clear Cardiac RRR      Assessment & Plan:

## 2011-06-21 NOTE — Assessment & Plan Note (Signed)
Slightly above goal.  Patient aware and will focus on diet and exercise.  Recheck in 6 months.

## 2011-06-21 NOTE — Assessment & Plan Note (Signed)
Controled

## 2012-01-11 ENCOUNTER — Encounter: Payer: Self-pay | Admitting: Family Medicine

## 2012-01-11 ENCOUNTER — Ambulatory Visit (INDEPENDENT_AMBULATORY_CARE_PROVIDER_SITE_OTHER): Payer: BC Managed Care – PPO | Admitting: Family Medicine

## 2012-01-11 VITALS — BP 128/88 | HR 70 | Temp 98.2°F | Ht 73.0 in | Wt 236.5 lb

## 2012-01-11 DIAGNOSIS — E785 Hyperlipidemia, unspecified: Secondary | ICD-10-CM

## 2012-01-11 DIAGNOSIS — M1A00X0 Idiopathic chronic gout, unspecified site, without tophus (tophi): Secondary | ICD-10-CM

## 2012-01-11 DIAGNOSIS — M1A9XX0 Chronic gout, unspecified, without tophus (tophi): Secondary | ICD-10-CM

## 2012-01-11 DIAGNOSIS — Z0000 Encounter for general adult medical examination without abnormal findings: Secondary | ICD-10-CM | POA: Insufficient documentation

## 2012-01-11 DIAGNOSIS — I10 Essential (primary) hypertension: Secondary | ICD-10-CM

## 2012-01-11 DIAGNOSIS — E039 Hypothyroidism, unspecified: Secondary | ICD-10-CM

## 2012-01-11 LAB — LIPID PANEL
LDL Cholesterol: 118 mg/dL — ABNORMAL HIGH (ref 0–99)
Triglycerides: 58 mg/dL (ref <150)
VLDL: 12 mg/dL (ref 0–40)

## 2012-01-11 LAB — URIC ACID: Uric Acid, Serum: 4.7 mg/dL (ref 4.0–7.8)

## 2012-01-11 LAB — TSH: TSH: 1.137 u[IU]/mL (ref 0.350–4.500)

## 2012-01-11 NOTE — Patient Instructions (Addendum)
It would be a good idea to get a blood pressure cuff.  Check about once a week.  If it is consistently above 140/90, I will need to increase your meds. Sign up for my chart.   I will call with lab results.  I may need to increase your crestor.  We will see. Great work on the weight loss.

## 2012-01-12 LAB — COMPLETE METABOLIC PANEL WITH GFR
ALT: 24 U/L (ref 0–53)
AST: 30 U/L (ref 0–37)
Albumin: 4.4 g/dL (ref 3.5–5.2)
CO2: 29 mEq/L (ref 19–32)
Calcium: 10 mg/dL (ref 8.4–10.5)
Chloride: 104 mEq/L (ref 96–112)
Creat: 0.82 mg/dL (ref 0.50–1.35)
GFR, Est African American: >89 mL/min
Potassium: 4.6 mEq/L (ref 3.5–5.3)

## 2012-01-14 ENCOUNTER — Encounter: Payer: Self-pay | Admitting: Family Medicine

## 2012-01-15 ENCOUNTER — Other Ambulatory Visit: Payer: Self-pay | Admitting: Family Medicine

## 2012-01-16 NOTE — Assessment & Plan Note (Signed)
Check labs 

## 2012-01-16 NOTE — Progress Notes (Signed)
  Subjective:    Patient ID: Kenneth Tapia, male    DOB: [birth year], 56 y.o.   MRN: [id number]  HPI Very healthy 56 yo male in for his annual physical.  He is active.  He has been doing an outstanding job with weight loss having lost 35 lbs through diet and exercise over the last year.  Up to date on health maint.  Declines flu shot despite my urging. Medical problems Seborreheic dermatitis controled with current meds. Hypertension well controled Hypercholesterolemia due for check.  Last cholesterol was above goal but that was before significant wt loss.  No unsafe habits or at risk behaviors.    Review of Systems Denies CP, SOB, bleeding, change in bowel or bladder habits.    Objective:   Physical ExamHEENT normal Neck supple without masses Lungs clear Cardiac RRR without m or g Abd benign Chest wall has small lipoma at midaxilary line on left side Ext no edema Neuro normal.        Assessment & Plan:

## 2012-01-16 NOTE — Assessment & Plan Note (Signed)
Healthy.  Great recent wt loss.  Up to date on screening.  Medical problems controled.

## 2012-01-16 NOTE — Assessment & Plan Note (Signed)
Well controled.  Check labs 

## 2012-02-22 ENCOUNTER — Other Ambulatory Visit: Payer: Self-pay | Admitting: Family Medicine

## 2012-03-01 ENCOUNTER — Other Ambulatory Visit: Payer: Self-pay | Admitting: Family Medicine

## 2012-03-30 ENCOUNTER — Other Ambulatory Visit: Payer: Self-pay | Admitting: Family Medicine

## 2012-05-16 ENCOUNTER — Other Ambulatory Visit: Payer: Self-pay | Admitting: Family Medicine

## 2012-05-26 ENCOUNTER — Other Ambulatory Visit: Payer: Self-pay | Admitting: Family Medicine

## 2012-07-02 ENCOUNTER — Other Ambulatory Visit: Payer: Self-pay | Admitting: Family Medicine

## 2012-08-16 ENCOUNTER — Other Ambulatory Visit: Payer: Self-pay | Admitting: Family Medicine

## 2012-08-18 ENCOUNTER — Other Ambulatory Visit: Payer: Self-pay | Admitting: Family Medicine

## 2012-08-22 ENCOUNTER — Other Ambulatory Visit: Payer: Self-pay | Admitting: Family Medicine

## 2012-09-30 ENCOUNTER — Other Ambulatory Visit: Payer: Self-pay | Admitting: *Deleted

## 2012-09-30 MED ORDER — ALLOPURINOL 100 MG PO TABS: 200.0000 mg | ORAL_TABLET | Freq: Every day | ORAL | Status: DC

## 2013-02-25 ENCOUNTER — Encounter: Payer: Self-pay | Admitting: Family Medicine

## 2013-02-25 ENCOUNTER — Ambulatory Visit (INDEPENDENT_AMBULATORY_CARE_PROVIDER_SITE_OTHER): Payer: BC Managed Care – PPO | Admitting: Family Medicine

## 2013-02-25 VITALS — BP 137/87 | HR 75 | Temp 98.6°F | Ht 73.0 in | Wt 250.2 lb

## 2013-02-25 DIAGNOSIS — M1A00X0 Idiopathic chronic gout, unspecified site, without tophus (tophi): Secondary | ICD-10-CM

## 2013-02-25 DIAGNOSIS — Z0000 Encounter for general adult medical examination without abnormal findings: Secondary | ICD-10-CM

## 2013-02-25 DIAGNOSIS — Z23 Encounter for immunization: Secondary | ICD-10-CM

## 2013-02-25 DIAGNOSIS — E785 Hyperlipidemia, unspecified: Secondary | ICD-10-CM

## 2013-02-25 DIAGNOSIS — L219 Seborrheic dermatitis, unspecified: Secondary | ICD-10-CM

## 2013-02-25 DIAGNOSIS — I10 Essential (primary) hypertension: Secondary | ICD-10-CM

## 2013-02-25 LAB — COMPLETE METABOLIC PANEL WITH GFR
AST: 34 U/L (ref 0–37)
BUN: 13 mg/dL (ref 6–23)
Calcium: 10 mg/dL (ref 8.4–10.5)
Chloride: 103 mEq/L (ref 96–112)
Creat: 0.93 mg/dL (ref 0.50–1.35)
GFR, Est African American: >89 mL/min
GFR, Est Non African American: >89 mL/min
Glucose, Bld: 76 mg/dL (ref 70–99)

## 2013-02-25 LAB — LIPID PANEL
Cholesterol: 204 mg/dL — ABNORMAL HIGH (ref 0–200)
Triglycerides: 71 mg/dL (ref <150)

## 2013-02-25 MED ORDER — ALLOPURINOL 100 MG PO TABS: 200.0000 mg | ORAL_TABLET | Freq: Every day | ORAL | Status: DC

## 2013-02-25 MED ORDER — AMLODIPINE BESYLATE 5 MG PO TABS: ORAL_TABLET | ORAL | Status: DC

## 2013-02-25 MED ORDER — CLOBETASOL PROPIONATE 0.05 % EX SOLN
CUTANEOUS | Status: DC
Start: 2013-02-25 — End: 2014-04-26

## 2013-02-25 NOTE — Patient Instructions (Signed)
Do sign up for My Chart Keep an eye on your weight and blood pressure (140/90 or less consistently) I sent in prescriptions to [facility] Remember for the first month, take only one allopurinol per day.  Then increase to two pills per day. The only lifestyle thing for good health is to get the weight lower.

## 2013-02-25 NOTE — Assessment & Plan Note (Signed)
Restart allopurinol 

## 2013-02-26 ENCOUNTER — Encounter: Payer: Self-pay | Admitting: Family Medicine

## 2013-02-26 NOTE — Assessment & Plan Note (Signed)
Good control.  Continue to follow

## 2013-02-26 NOTE — Assessment & Plan Note (Signed)
Healthy male.  Obesity is only lifestyle worry.  Chronic problems of HBP and high chol seem under control.

## 2013-02-26 NOTE — Progress Notes (Signed)
   Subjective:    Patient ID: Kenneth Tapia, male    DOB: [birth year], 57 y.o.   MRN: [id number]  HPI  Here for annual physical.  No complaints.  He recognizes that he has let his wt creep up over the past year.  He exercises, does not smoke and has no unhealthy habits.   HBP, compliant with meds.  Home BPs are like today's reading: just on the cusp of acceptable/need for increased treatment. He is not taking his allopurinol due to confusion over refill.  Wants to restart.   Dermatitis under control with meds. I am not sure why hypothyroid on problem list.  He is not on replacement and multiple recent TSH have been normal. Needs cholesterol check.  Tolerating crestor without difficulty.     Review of SystemsNo CP, SOB, appetite change, change in bowel or bladder, bleeding or worrisome skin problems.     Objective:   Physical Exam HEENT normal partial cerumen impaction irrigated clear Neck supple without nodes Lungs clear Cardiac RRR without m or g Abd benign Ext no edema       Assessment & Plan:

## 2013-02-26 NOTE — Assessment & Plan Note (Addendum)
Check lipid panel  

## 2013-02-26 NOTE — Assessment & Plan Note (Signed)
Refill meds

## 2013-03-04 ENCOUNTER — Telehealth: Payer: Self-pay | Admitting: Family Medicine

## 2013-03-04 MED ORDER — ROSUVASTATIN CALCIUM 40 MG PO TABS: 40.0000 mg | ORAL_TABLET | Freq: Every day | ORAL | Status: DC

## 2013-03-04 NOTE — Telephone Encounter (Signed)
Pt called to let Dr. Leveda Anna know that he agreed to have his Crestor increased to 40 mg. Myriam Jacobson

## 2013-03-04 NOTE — Telephone Encounter (Signed)
LVM for patient to call back. ?

## 2013-03-05 NOTE — Telephone Encounter (Signed)
LVM for patient to call back. ?

## 2013-08-14 ENCOUNTER — Encounter: Payer: Self-pay | Admitting: Family Medicine

## 2013-08-14 ENCOUNTER — Ambulatory Visit (INDEPENDENT_AMBULATORY_CARE_PROVIDER_SITE_OTHER): Payer: BC Managed Care – PPO | Admitting: Family Medicine

## 2013-08-14 VITALS — BP 144/92 | HR 85 | Temp 98.8°F | Ht 73.0 in | Wt 246.1 lb

## 2013-08-14 DIAGNOSIS — I10 Essential (primary) hypertension: Secondary | ICD-10-CM

## 2013-08-14 DIAGNOSIS — M545 Low back pain, unspecified: Secondary | ICD-10-CM

## 2013-08-14 DIAGNOSIS — M5416 Radiculopathy, lumbar region: Secondary | ICD-10-CM | POA: Insufficient documentation

## 2013-08-14 MED ORDER — AMLODIPINE BESYLATE 10 MG PO TABS: 10.0000 mg | ORAL_TABLET | Freq: Every day | ORAL | Status: DC

## 2013-08-14 NOTE — Progress Notes (Signed)
   Subjective:    Patient ID: Kenneth Tapia, male    DOB: [birth year], 58 y.o.   MRN: [id number]  HPI Low back pain.  He has had mild in the past - not enough to make problem list.  I do see old LS spine films from 7 years ago.  No obvious precipitating event.  No fever or change in bowel or bladder.  Pain with movement and on sitting.  Pain seems lower in his buttocks region than previous flairs. No radiculopathy.  Pain is symetric    Review of Systems     Objective:   Physical Exam Loss of lumbar lordosis and limited ROM due to spasm.  Some tenderness to deep palpation of the SI joints. Gait normal no leg weakness        Assessment & Plan:

## 2013-08-14 NOTE — Patient Instructions (Signed)
This seems typical low back pain.  You may have an element of sacroiliitis.   Keep taking the aleve and using stretching exercises. Let me know if it doesn't go away as expected.  I will do more tests I sent in an new prescription for 10 mg amlodipine.  You can use up your current supply by taking 2 of the five mg pills each. See me in 5-6 months.  Keep a log of home blood pressures and bring in at the time.

## 2013-08-14 NOTE — Assessment & Plan Note (Signed)
Elevated.  Will increase amlodipine.

## 2013-08-14 NOTE — Assessment & Plan Note (Signed)
Seems typical except may have an SI joint componant.  He knows low back stretching exercises.  He gets good relief with Aleve.  Does not want prescription NSAID or muscle relaxant at this point.  Came in only to be assured no serious underlying problem.

## 2014-04-26 ENCOUNTER — Other Ambulatory Visit: Payer: Self-pay | Admitting: Family Medicine

## 2014-05-14 ENCOUNTER — Encounter: Payer: Self-pay | Admitting: Family Medicine

## 2014-05-14 ENCOUNTER — Ambulatory Visit (INDEPENDENT_AMBULATORY_CARE_PROVIDER_SITE_OTHER): Payer: BC Managed Care – PPO | Admitting: Family Medicine

## 2014-05-14 VITALS — BP 146/88 | HR 90 | Temp 99.0°F | Ht 73.0 in | Wt 269.0 lb

## 2014-05-14 DIAGNOSIS — E7800 Pure hypercholesterolemia, unspecified: Secondary | ICD-10-CM

## 2014-05-14 DIAGNOSIS — Z23 Encounter for immunization: Secondary | ICD-10-CM

## 2014-05-14 DIAGNOSIS — M1A0790 Idiopathic chronic gout, unspecified ankle and foot, without tophus (tophi): Secondary | ICD-10-CM | POA: Diagnosis not present

## 2014-05-14 DIAGNOSIS — Z113 Encounter for screening for infections with a predominantly sexual mode of transmission: Secondary | ICD-10-CM | POA: Insufficient documentation

## 2014-05-14 DIAGNOSIS — Z0000 Encounter for general adult medical examination without abnormal findings: Secondary | ICD-10-CM | POA: Diagnosis not present

## 2014-05-14 DIAGNOSIS — I10 Essential (primary) hypertension: Secondary | ICD-10-CM

## 2014-05-14 LAB — COMPREHENSIVE METABOLIC PANEL
ALK PHOS: 73 U/L (ref 39–117)
ALT: 45 U/L (ref 0–53)
AST: 34 U/L (ref 0–37)
Albumin: 4.5 g/dL (ref 3.5–5.2)
BUN: 9 mg/dL (ref 6–23)
CALCIUM: 9.5 mg/dL (ref 8.4–10.5)
CHLORIDE: 101 meq/L (ref 96–112)
CO2: 25 mEq/L (ref 19–32)
Creat: 0.93 mg/dL (ref 0.50–1.35)
Glucose, Bld: 100 mg/dL — ABNORMAL HIGH (ref 70–99)
Potassium: 4.2 mEq/L (ref 3.5–5.3)
SODIUM: 141 meq/L (ref 135–145)
Total Bilirubin: 0.6 mg/dL (ref 0.2–1.2)
Total Protein: 7.6 g/dL (ref 6.0–8.3)

## 2014-05-14 LAB — LIPID PANEL
Cholesterol: 173 mg/dL (ref 0–200)
HDL: 65 mg/dL (ref >=40)
LDL CALC: 93 mg/dL (ref 0–99)
Total CHOL/HDL Ratio: 2.7 Ratio
Triglycerides: 75 mg/dL (ref <150)
VLDL: 15 mg/dL (ref 0–40)

## 2014-05-14 LAB — URIC ACID: URIC ACID, SERUM: 4.9 mg/dL (ref 4.0–7.8)

## 2014-05-14 MED ORDER — HYDROCHLOROTHIAZIDE 25 MG PO TABS: 25.0000 mg | ORAL_TABLET | Freq: Every day | ORAL | Status: DC

## 2014-05-14 MED ORDER — AMLODIPINE BESYLATE 10 MG PO TABS: 10.0000 mg | ORAL_TABLET | Freq: Every day | ORAL | Status: DC

## 2014-05-14 NOTE — Progress Notes (Signed)
   Subjective:    Patient ID: Kenneth Tapia, male    DOB: 04/18/1955, 59 y.o.   MRN: [id number]  HPI  Annual physical.   Doing well but does have several complaints. Swelling of ankles. BP control sub optimal Mother dying of dementia, not expected to last the month. HPDP Never screened for HIV Needs flu and pneumonia. Needs cholesterol screen. Not exercising and weight has crept up   Review of Systems ROS neg     Objective:   Physical Exam Neck without masses or thyromegally Lungs clear Cardiac RRR without m or g Abd benign Ext 1+ edema       Assessment & Plan:

## 2014-05-14 NOTE — Assessment & Plan Note (Addendum)
Stressed obviously by dying mother.   He needs to refocus on weight reduction.

## 2014-05-14 NOTE — Assessment & Plan Note (Signed)
Low risk. One time screen for HIV

## 2014-05-14 NOTE — Assessment & Plan Note (Signed)
Warned that adding HCTZ might flair gout.  Check uric acid to make sure at goal with allopurinol Rx.

## 2014-05-14 NOTE — Assessment & Plan Note (Signed)
Poor control plus edema.  Will add HCTZ and watch for gout flair.

## 2014-05-14 NOTE — Assessment & Plan Note (Signed)
Needs recheck

## 2014-05-14 NOTE — Patient Instructions (Addendum)
When you turn 59, you will be due for a shingles.  Call me and I will send the prescription to your pharmacy. Next year, get your flu shot.  Typically, October is the best time. I will call with blood test results. Please log into My Chart.  Instead of calling, you can send me a my chart message. I refilled your amlodipine. I sent in a new fluid pill/blood pressure med - hydrochlorothiazide.  Let me know if any gout flairs. You look in good shape for everything except your weight. When you call about the shingles vaccine, reassure me that your home blood pressures have come under control

## 2014-05-15 LAB — HIV ANTIBODY (ROUTINE TESTING W REFLEX): HIV 1&2 Ab, 4th Generation: NONREACTIVE

## 2014-05-17 ENCOUNTER — Encounter: Payer: Self-pay | Admitting: Family Medicine

## 2014-08-13 ENCOUNTER — Encounter: Payer: Self-pay | Admitting: Family Medicine

## 2014-08-17 MED ORDER — ZOSTER VACCINE LIVE 19400 UNT/0.65ML ~~LOC~~ SOLR: 0.6500 mL | Freq: Once | SUBCUTANEOUS | Status: DC

## 2014-09-08 ENCOUNTER — Encounter: Payer: Self-pay | Admitting: Family Medicine

## 2015-05-18 ENCOUNTER — Encounter: Payer: Self-pay | Admitting: Family Medicine

## 2015-05-18 ENCOUNTER — Ambulatory Visit (INDEPENDENT_AMBULATORY_CARE_PROVIDER_SITE_OTHER): Payer: BC Managed Care – PPO | Admitting: Family Medicine

## 2015-05-18 ENCOUNTER — Other Ambulatory Visit: Payer: Self-pay | Admitting: Family Medicine

## 2015-05-18 VITALS — BP 144/86 | HR 83 | Temp 98.4°F | Ht 73.0 in | Wt 252.0 lb

## 2015-05-18 DIAGNOSIS — Z1159 Encounter for screening for other viral diseases: Secondary | ICD-10-CM

## 2015-05-18 DIAGNOSIS — I10 Essential (primary) hypertension: Secondary | ICD-10-CM | POA: Diagnosis not present

## 2015-05-18 DIAGNOSIS — Z0000 Encounter for general adult medical examination without abnormal findings: Secondary | ICD-10-CM

## 2015-05-18 DIAGNOSIS — E7800 Pure hypercholesterolemia, unspecified: Secondary | ICD-10-CM | POA: Diagnosis not present

## 2015-05-18 DIAGNOSIS — M1A0720 Idiopathic chronic gout, left ankle and foot, without tophus (tophi): Secondary | ICD-10-CM

## 2015-05-18 DIAGNOSIS — Z125 Encounter for screening for malignant neoplasm of prostate: Secondary | ICD-10-CM

## 2015-05-18 LAB — LIPID PANEL
CHOLESTEROL: 132 mg/dL (ref 125–200)
HDL: 58 mg/dL (ref >=40)
LDL Cholesterol: 64 mg/dL (ref <130)
Total CHOL/HDL Ratio: 2.3 Ratio (ref <=5.0)
Triglycerides: 52 mg/dL (ref <150)
VLDL: 10 mg/dL (ref <30)

## 2015-05-18 LAB — COMPLETE METABOLIC PANEL WITH GFR
ALBUMIN: 4.4 g/dL (ref 3.6–5.1)
ALK PHOS: 71 U/L (ref 40–115)
ALT: 44 U/L (ref 9–46)
AST: 44 U/L — AB (ref 10–35)
BILIRUBIN TOTAL: 0.6 mg/dL (ref 0.2–1.2)
BUN: 14 mg/dL (ref 7–25)
CALCIUM: 9.3 mg/dL (ref 8.6–10.3)
CO2: 30 mmol/L (ref 20–31)
CREATININE: 0.97 mg/dL (ref 0.70–1.25)
Chloride: 104 mmol/L (ref 98–110)
GFR, Est African American: >89 mL/min (ref >=60)
GFR, Est Non African American: 84 mL/min (ref >=60)
GLUCOSE: 90 mg/dL (ref 65–99)
Potassium: 3.6 mmol/L (ref 3.5–5.3)
SODIUM: 142 mmol/L (ref 135–146)
TOTAL PROTEIN: 7.8 g/dL (ref 6.1–8.1)

## 2015-05-18 LAB — URIC ACID: URIC ACID, SERUM: 5.8 mg/dL (ref 4.0–7.8)

## 2015-05-18 LAB — HEPATITIS C ANTIBODY: HCV Ab: NEGATIVE

## 2015-05-18 MED ORDER — ZOSTER VACCINE LIVE 19400 UNT/0.65ML ~~LOC~~ SOLR: 0.6500 mL | Freq: Once | SUBCUTANEOUS | Status: AC

## 2015-05-18 NOTE — Progress Notes (Signed)
   Subjective:    Patient ID: Shaw Dobek, male    DOB: 23-Apr-1955, 60 y.o.   MRN: [id number]  HPI  Annual physical and FU problems. Denies complaints.   1. HBP home BP run ~135/85  Tolerating current meds without difficulty.   2. No recent gout flairs.  On his own, he has cut allopurinol to 100 mg daily.   3. Due for cholesterol check.  No symptoms on generic crestor.  Primary prevention.  Familty has cardiac disease, but not CAD for sure (Mom CHF, Dad needs pacer) 4. Working on diet and exercise.  Small wt loss noted.  Intentional  5.  HPDP due for Hep C screen and zostavax.  Desires prostate cancer screen despite knowing not currently recommended.    Review of Systems Denies Chest pain, DOE, wt change, appetite change, change in bowel or bladder, focal neuro sx, headache,   Does have occasional slight bilateral ankle swelling.       Objective:   Physical ExamHEENT  Normal Neck normal thyroid, no nodes, Lungs clear Cardiac RRR without m or g Abd benign. Ext no edema Neuro Motor and sensory grossly intact.  Gait normal        Assessment & Plan:

## 2015-05-18 NOTE — Assessment & Plan Note (Signed)
Healthy male.  Main issue is obesity and managing chronic problems of HBP and high chol.

## 2015-05-18 NOTE — Patient Instructions (Addendum)
I sent in a prescription for the shingles vaccine to [facility].  Let me know if you get it so that I can update my records.   I did a bunch of blood work.  I will call with results and you will be able to view on my chart.   Good job on the weight loss.  Keep it up.  From a medical standpoint, you are still considered obese.  BMI=33 Make sure you keep an eye on your blood pressure at home.  You are dancing on the line of needing more medication.  See me in 1 year if things are going great.  See me any time your home blood pressures are above 10/90.  If we want to be very aggressive, goal home blood pressure is below 130/80.  See me in six months if your home blood pressures remain above 130/80

## 2015-05-18 NOTE — Assessment & Plan Note (Signed)
Borderline high.  Cont current meds, home BP, FU 6 months.

## 2015-05-18 NOTE — Assessment & Plan Note (Signed)
Discussed screening not recommend by [facility].  He desires to be screened.

## 2015-05-18 NOTE — Assessment & Plan Note (Signed)
Check Uric acid on lower dose of allopurinol

## 2015-05-18 NOTE — Assessment & Plan Note (Signed)
Due for labs

## 2015-05-20 ENCOUNTER — Other Ambulatory Visit: Payer: Self-pay | Admitting: Family Medicine

## 2015-05-20 DIAGNOSIS — E7800 Pure hypercholesterolemia, unspecified: Secondary | ICD-10-CM

## 2015-05-20 DIAGNOSIS — L219 Seborrheic dermatitis, unspecified: Secondary | ICD-10-CM

## 2015-05-20 MED ORDER — ROSUVASTATIN CALCIUM 20 MG PO TABS: 20.0000 mg | ORAL_TABLET | Freq: Every day | ORAL | Status: AC

## 2015-05-20 MED ORDER — CLOBETASOL PROPIONATE 0.05 % EX SOLN: CUTANEOUS | Status: AC

## 2015-05-20 NOTE — Assessment & Plan Note (Signed)
Given results to patient.  Based on low LDL, will decrease crestor to 20 mg daily.

## 2015-05-21 LAB — PSA: PSA: 0.95 ng/mL (ref <=4.00)

## 2015-05-26 ENCOUNTER — Encounter: Payer: Self-pay | Admitting: Family Medicine

## 2015-05-26 DIAGNOSIS — I10 Essential (primary) hypertension: Secondary | ICD-10-CM

## 2015-05-30 MED ORDER — FLUTICASONE PROPIONATE 0.05 % EX CREA: TOPICAL_CREAM | CUTANEOUS | Status: AC

## 2015-05-30 MED ORDER — HYDROCHLOROTHIAZIDE 25 MG PO TABS: 25.0000 mg | ORAL_TABLET | Freq: Every day | ORAL | Status: AC

## 2015-05-30 MED ORDER — AMLODIPINE BESYLATE 10 MG PO TABS: 10.0000 mg | ORAL_TABLET | Freq: Every day | ORAL | Status: AC

## 2015-05-30 MED ORDER — ALLOPURINOL 100 MG PO TABS
200.0000 mg | ORAL_TABLET | Freq: Every day | ORAL | Status: AC
Start: 2015-05-30 — End: ?

## 2015-05-30 MED ORDER — KETOCONAZOLE 2 % EX CREA: TOPICAL_CREAM | Freq: Two times a day (BID) | CUTANEOUS | Status: AC

## 2015-07-21 ENCOUNTER — Ambulatory Visit (INDEPENDENT_AMBULATORY_CARE_PROVIDER_SITE_OTHER): Payer: BC Managed Care – PPO | Admitting: Family Medicine

## 2015-07-21 ENCOUNTER — Ambulatory Visit
Admission: RE | Admit: 2015-07-21 | Discharge: 2015-07-21 | Disposition: A | Discharge status: Home / Self Care | Payer: BC Managed Care – PPO | Source: Ambulatory Visit | Attending: Family Medicine | Admitting: Family Medicine

## 2015-07-21 ENCOUNTER — Encounter: Payer: Self-pay | Admitting: Family Medicine

## 2015-07-21 VITALS — BP 135/87 | HR 78 | Temp 98.3°F | Wt 237.6 lb

## 2015-07-21 DIAGNOSIS — M5417 Radiculopathy, lumbosacral region: Secondary | ICD-10-CM | POA: Diagnosis not present

## 2015-07-21 DIAGNOSIS — M5416 Radiculopathy, lumbar region: Secondary | ICD-10-CM

## 2015-07-21 LAB — POCT SEDIMENTATION RATE: POCT SED RATE: 18 mm/hr (ref 0–22)

## 2015-07-21 LAB — RHEUMATOID FACTOR: Rhuematoid fact SerPl-aCnc: 10 IU/mL (ref <=14)

## 2015-07-21 MED ORDER — GABAPENTIN 300 MG PO CAPS: 300.0000 mg | ORAL_CAPSULE | Freq: Three times a day (TID) | ORAL | Status: AC

## 2015-07-21 NOTE — Assessment & Plan Note (Addendum)
L1 nerve distribution corresponds to known spondylolythesis.  X rays reveil progression of DDD but no major bone slippage.    Rx with gabapentin.  No advanced imaging for 6 weeks provided he does not worsen.

## 2015-07-21 NOTE — Patient Instructions (Signed)
I want to get x rays to make sure the back slippage is not worse. I will call with blood and x ray results I sent in a nerve pain medication.  Take it with the Aleve. Call me in a week if the pain is not better controled If you are having lots of pain in 6 weeks see me and we will probably get an MRI. See me immediately if weakness, bowel or bladder problems.

## 2015-07-22 NOTE — Progress Notes (Signed)
   Subjective:    Patient ID: Kenneth Tapia, male    DOB: 12/22/1955, 60 y.o.   MRN: [id number]  HPI  Patient with known low back pain L1, L2 spondylolysthesis and DJD form old X rays, who has not had much chronic back pain, just occasional episodic once every few years.  He has another episode of his back pain, this time with radiation to the right groin and hip area.  He does not think that the problem is in the hip joint.  No weakness.  No bowel or bladder changes.  Current episode has been going on for about a week.  OTC meds are not controling the discomfort.    Review of Systems     Objective:   Physical Exam Paraspinous muscle tenderness Right>Left.  Rt lower ext normal strength, sensation and weakness.    I did reviewed L1 dermatone and that matches well with his complaint of pain.  Rt hip FROM with no pain to internal/external rotation.       Assessment & Plan:

## 2016-05-13 ENCOUNTER — Encounter: Payer: Self-pay | Admitting: Family Medicine

## 2016-08-05 IMAGING — CR DG LUMBAR SPINE COMPLETE 4+V
6 series · 6 of 6 positions shown · non-contrast
Comparison: Lumbar spine series of August 20, 2005

CLINICAL DATA: Recurrent low back pain after 2 years of no
symptoms; pain radiates into the right leg and has done so for the
past week.

EXAM:
LUMBAR SPINE - COMPLETE 4+ VIEW

[w lumbar spine ap]
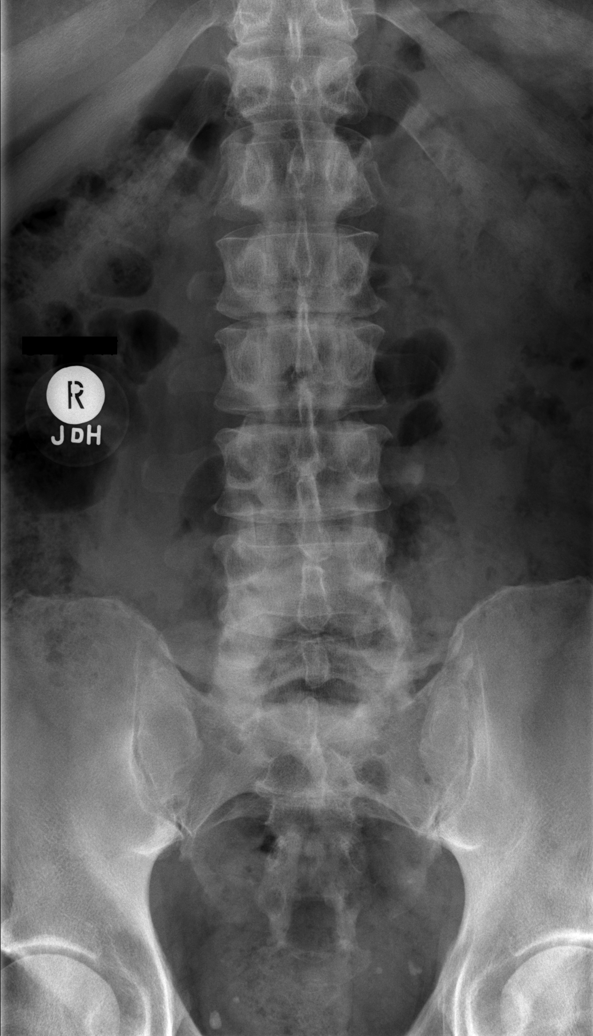

[w lumbar spine obl (1 of 3)]
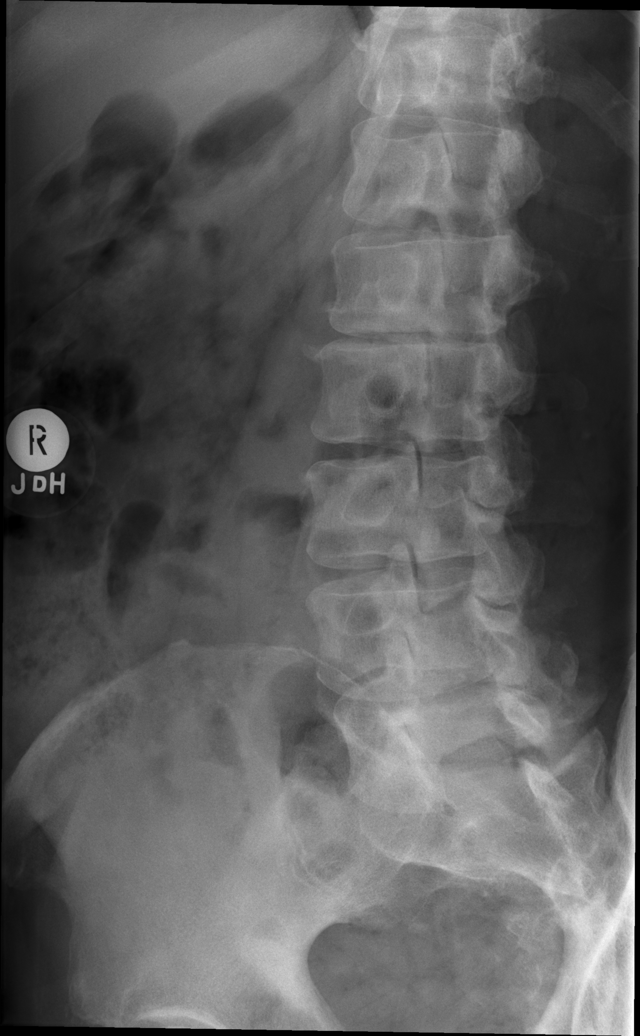

[w lumbar spine obl (2 of 3)]
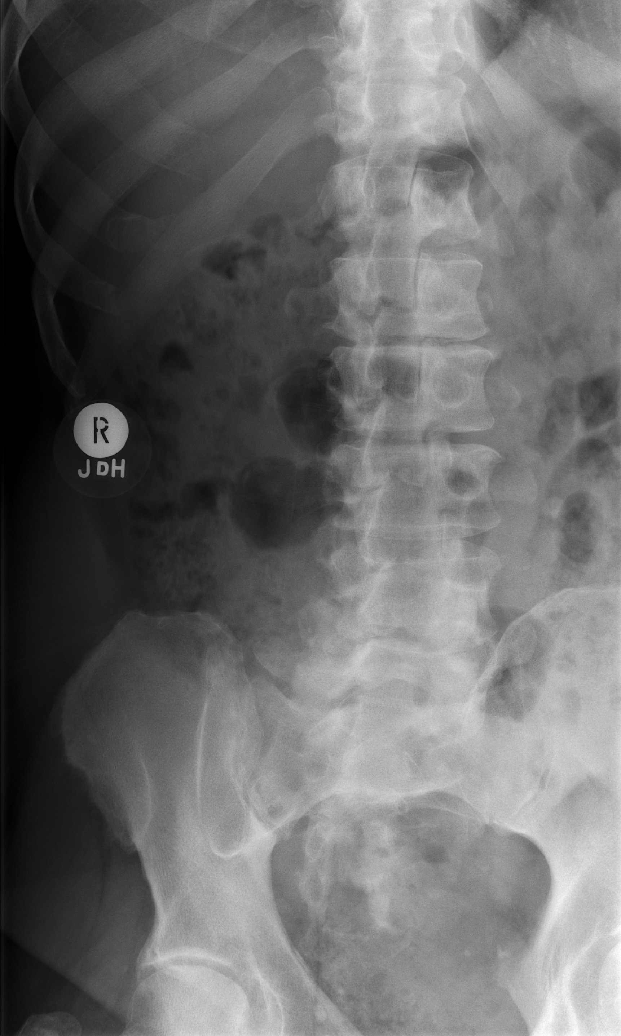

[w lumbar spine obl (3 of 3)]
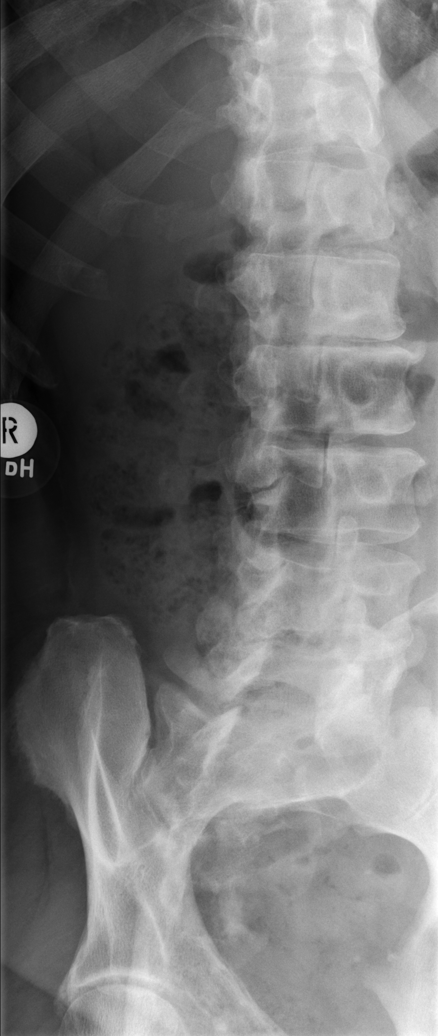

[w lumbar spine lat]
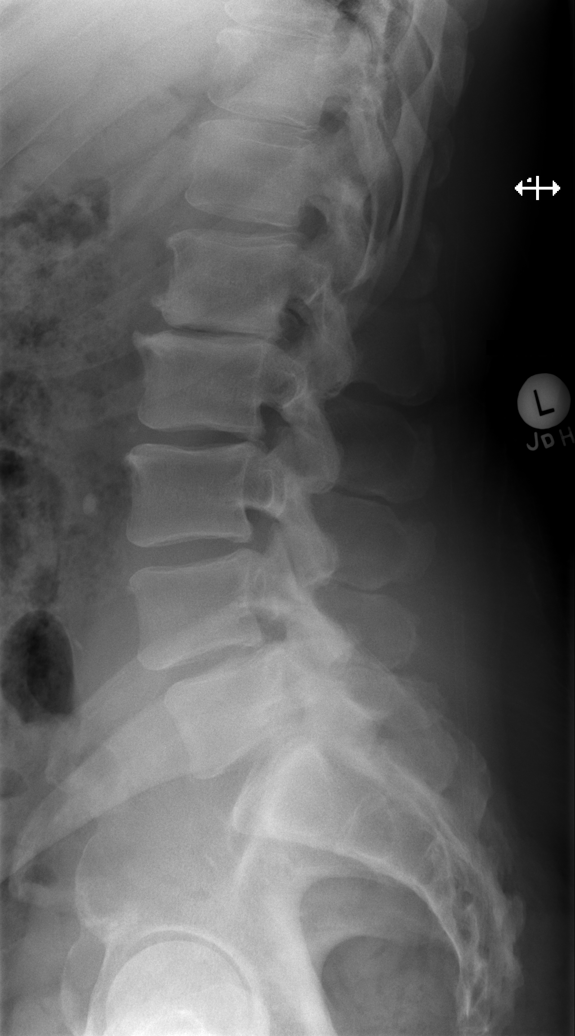

[w lumbar l-5 s-1 spot]
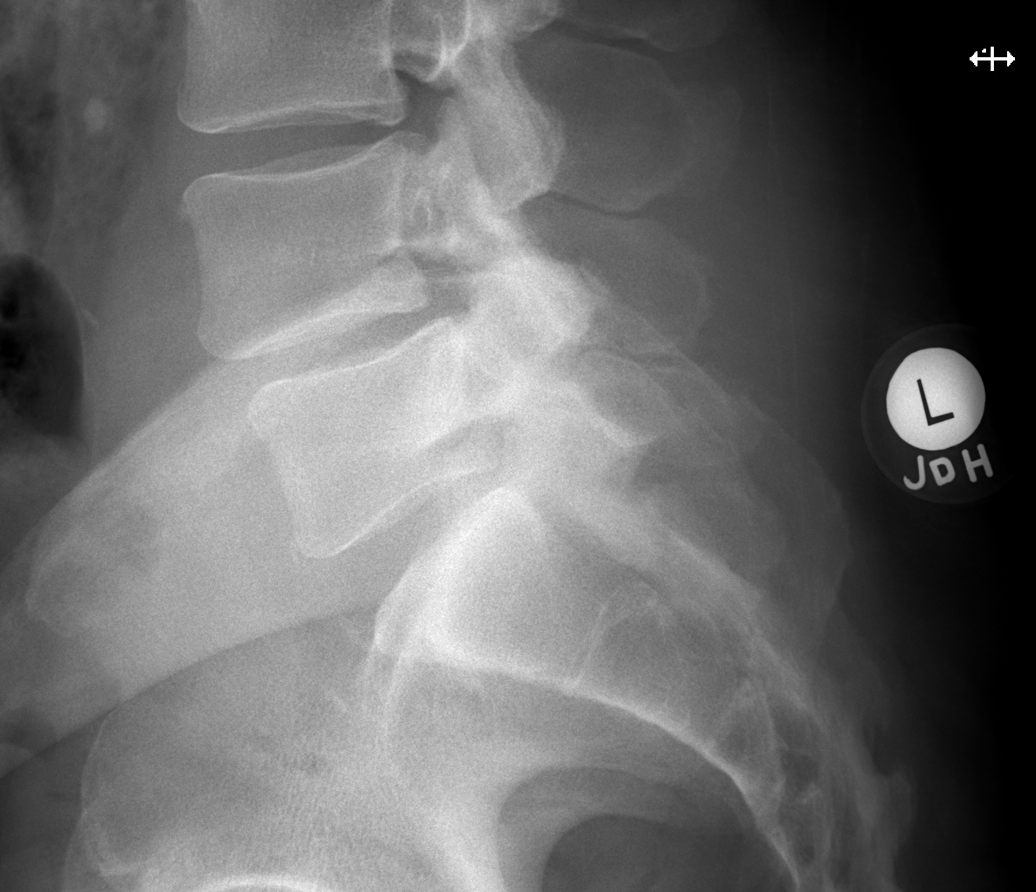

[6 of 6 positions shown; findings below may reference images not displayed]

FINDINGS: The lumbar vertebral bodies are preserved in height. There is new
grade 1 anterolisthesis of L4 with respect L5. There is moderate
disc space height loss at L4-5 which has progressed since the
previous study. No pars defects are observed. Previously
demonstrated minimal retrolisthesis of L1 with respect L2 is stable.
The pedicles and transverse processes are intact. The observed
portions of the sacrum are normal.
IMPRESSION: Progression of degenerative discs space height loss at multiple
levels. New grade 1 anterolisthesis of L4 with respect L5 with
stable minimal retrolisthesis of L1 with respect L2. There is no
compression fracture.

## 2021-08-22 ENCOUNTER — Encounter: Payer: Self-pay | Admitting: *Deleted
